# Patient Record
Sex: Male | Born: 1960 | Race: Black or African American | Hispanic: No | Marital: Single | Smoking: Never smoker
Health system: Southern US, Community
[De-identification: ages and names within clinical notes are randomized; demographics above are authoritative.]

## PROBLEM LIST (undated history)

## (undated) DIAGNOSIS — R569 Unspecified convulsions: Secondary | ICD-10-CM

## (undated) DIAGNOSIS — I10 Essential (primary) hypertension: Secondary | ICD-10-CM

---

## 2015-03-05 ENCOUNTER — Observation Stay
Admission: EM | Admit: 2015-03-05 | Discharge: 2015-03-06 | Disposition: A | Discharge status: Home / Self Care | Payer: No Typology Code available for payment source | Attending: Internal Medicine | Admitting: Internal Medicine

## 2015-03-05 ENCOUNTER — Emergency Department: Payer: No Typology Code available for payment source

## 2015-03-05 DIAGNOSIS — E872 Acidosis: Secondary | ICD-10-CM | POA: Diagnosis present

## 2015-03-05 DIAGNOSIS — R55 Syncope and collapse: Secondary | ICD-10-CM | POA: Insufficient documentation

## 2015-03-05 DIAGNOSIS — I10 Essential (primary) hypertension: Secondary | ICD-10-CM | POA: Insufficient documentation

## 2015-03-05 DIAGNOSIS — R569 Unspecified convulsions: Principal | ICD-10-CM

## 2015-03-05 DIAGNOSIS — I739 Peripheral vascular disease, unspecified: Secondary | ICD-10-CM | POA: Diagnosis not present

## 2015-03-05 DIAGNOSIS — E8729 Other acidosis: Secondary | ICD-10-CM

## 2015-03-05 DIAGNOSIS — R4182 Altered mental status, unspecified: Secondary | ICD-10-CM | POA: Insufficient documentation

## 2015-03-05 DIAGNOSIS — E860 Dehydration: Secondary | ICD-10-CM

## 2015-03-05 DIAGNOSIS — G9340 Encephalopathy, unspecified: Secondary | ICD-10-CM | POA: Insufficient documentation

## 2015-03-05 DIAGNOSIS — E876 Hypokalemia: Secondary | ICD-10-CM | POA: Insufficient documentation

## 2015-03-05 HISTORY — DX: Unspecified convulsions: R56.9

## 2015-03-05 HISTORY — DX: Essential (primary) hypertension: I10

## 2015-03-05 LAB — BASIC METABOLIC PANEL
ANION GAP: 22 — AB (ref 5–15)
Anion gap: 12 (ref 5–15)
BUN: 6 mg/dL (ref 6–20)
CALCIUM: 7.9 mg/dL — AB (ref 8.9–10.3)
CALCIUM: 7.6 mg/dL — AB (ref 8.9–10.3)
CHLORIDE: 103 mmol/L (ref 101–111)
CO2: 19 mmol/L — AB (ref 22–32)
CO2: 25 mmol/L (ref 22–32)
CREATININE: 1.21 mg/dL (ref 0.61–1.24)
CREATININE: 0.94 mg/dL (ref 0.61–1.24)
Chloride: 96 mmol/L — ABNORMAL LOW (ref 101–111)
GFR calc Af Amer: >60 mL/min (ref >60)
GFR calc non Af Amer: >60 mL/min (ref >60)
GFR calc non Af Amer: >60 mL/min (ref >60)
GLUCOSE: 201 mg/dL — AB (ref 65–99)
Glucose, Bld: 108 mg/dL — ABNORMAL HIGH (ref 65–99)
Potassium: 2.6 mmol/L — CL (ref 3.5–5.1)
Potassium: 2.8 mmol/L — CL (ref 3.5–5.1)
Sodium: 137 mmol/L (ref 135–145)
Sodium: 140 mmol/L (ref 135–145)

## 2015-03-05 LAB — HEPATIC FUNCTION PANEL
ALK PHOS: 80 U/L (ref 38–126)
ALT: 91 U/L — AB (ref 17–63)
AST: 158 U/L — ABNORMAL HIGH (ref 15–41)
Albumin: 4.4 g/dL (ref 3.5–5.0)
BILIRUBIN DIRECT: 0.3 mg/dL (ref 0.1–0.5)
BILIRUBIN INDIRECT: 1.1 mg/dL — AB (ref 0.3–0.9)
Total Bilirubin: 1.4 mg/dL — ABNORMAL HIGH (ref 0.3–1.2)
Total Protein: 7.8 g/dL (ref 6.5–8.1)

## 2015-03-05 LAB — BLOOD GAS, VENOUS
PH VEN: 7.42 (ref 7.320–7.430)
Patient temperature: 37
pCO2, Ven: 27 mmHg — ABNORMAL LOW (ref 44.0–60.0)

## 2015-03-05 LAB — ETHANOL: Alcohol, Ethyl (B): <5 mg/dL (ref <5)

## 2015-03-05 LAB — LIPASE, BLOOD: Lipase: 26 U/L (ref 22–51)

## 2015-03-05 MED ORDER — POTASSIUM CHLORIDE CRYS ER 20 MEQ PO TBCR
EXTENDED_RELEASE_TABLET | ORAL | Status: AC
Start: 2015-03-05 — End: 2015-03-05
  Administered 2015-03-05: 40 meq via ORAL
  Filled 2015-03-05: qty 2

## 2015-03-05 MED ORDER — POTASSIUM CHLORIDE 10 MEQ/100ML IV SOLN
10.0000 meq | INTRAVENOUS | Status: AC
  Administered 2015-03-06 (×3): 10 meq via INTRAVENOUS
  Filled 2015-03-05 (×3): qty 100

## 2015-03-05 MED ORDER — SODIUM CHLORIDE 0.9 % IV BOLUS (SEPSIS)
2000.0000 mL | Freq: Once | INTRAVENOUS | Status: AC
  Administered 2015-03-05: 2000 mL via INTRAVENOUS

## 2015-03-05 MED ORDER — ASPIRIN EC 81 MG PO TBEC
81.0000 mg | DELAYED_RELEASE_TABLET | Freq: Every day | ORAL | Status: DC
  Administered 2015-03-06: 10:00:00 81 mg via ORAL
  Filled 2015-03-05: qty 1

## 2015-03-05 MED ORDER — ENOXAPARIN SODIUM 40 MG/0.4ML ~~LOC~~ SOLN: 40.0000 mg | SUBCUTANEOUS | Status: DC

## 2015-03-05 MED ORDER — ONDANSETRON HCL 4 MG PO TABS
4.0000 mg | ORAL_TABLET | Freq: Four times a day (QID) | ORAL | Status: DC | PRN
Start: 2015-03-05 — End: 2015-03-06

## 2015-03-05 MED ORDER — POTASSIUM CHLORIDE CRYS ER 20 MEQ PO TBCR
40.0000 meq | EXTENDED_RELEASE_TABLET | ORAL | Status: AC
  Administered 2015-03-06: 40 meq via ORAL
  Filled 2015-03-05: qty 2

## 2015-03-05 MED ORDER — POTASSIUM CHLORIDE IN NACL 20-0.9 MEQ/L-% IV SOLN
INTRAVENOUS | Status: DC
  Administered 2015-03-05 – 2015-03-06 (×2): via INTRAVENOUS
  Filled 2015-03-05 (×6): qty 1000

## 2015-03-05 MED ORDER — HYDRALAZINE HCL 20 MG/ML IJ SOLN
INTRAMUSCULAR | Status: AC
  Administered 2015-03-05: 10 mg
  Filled 2015-03-05: qty 1

## 2015-03-05 MED ORDER — DEXTROSE 5 % AND 0.9 % NACL IV BOLUS
1000.0000 mL | Freq: Once | INTRAVENOUS | Status: AC
  Administered 2015-03-05: 1000 mL via INTRAVENOUS
  Filled 2015-03-05: qty 1000

## 2015-03-05 MED ORDER — LORAZEPAM 2 MG/ML IJ SOLN
INTRAMUSCULAR | Status: AC
  Administered 2015-03-05: 19:00:00
  Filled 2015-03-05: qty 1

## 2015-03-05 MED ORDER — ONDANSETRON HCL 4 MG/2ML IJ SOLN: 4.0000 mg | Freq: Four times a day (QID) | INTRAMUSCULAR | Status: DC | PRN

## 2015-03-05 MED ORDER — POTASSIUM CHLORIDE CRYS ER 20 MEQ PO TBCR
EXTENDED_RELEASE_TABLET | ORAL | Status: AC
  Administered 2015-03-05: 40 meq
  Filled 2015-03-05: qty 2

## 2015-03-05 MED ORDER — LACOSAMIDE 50 MG PO TABS
100.0000 mg | ORAL_TABLET | Freq: Two times a day (BID) | ORAL | Status: DC
  Administered 2015-03-06: 10:00:00 100 mg via ORAL
  Filled 2015-03-05: qty 2

## 2015-03-05 MED ORDER — LISINOPRIL 20 MG PO TABS
ORAL_TABLET | ORAL | Status: AC
  Administered 2015-03-05: 10 mg
  Filled 2015-03-05: qty 1

## 2015-03-05 MED ORDER — POTASSIUM CHLORIDE CRYS ER 20 MEQ PO TBCR
40.0000 meq | EXTENDED_RELEASE_TABLET | Freq: Once | ORAL | Status: AC
  Administered 2015-03-05: 40 meq via ORAL

## 2015-03-05 MED ORDER — HYDRALAZINE HCL 20 MG/ML IJ SOLN: 10.0000 mg | Freq: Four times a day (QID) | INTRAMUSCULAR | Status: DC | PRN

## 2015-03-05 MED ORDER — SODIUM CHLORIDE 0.9 % IV SOLN
200.0000 mg | Freq: Once | INTRAVENOUS | Status: AC
  Administered 2015-03-05: 200 mg via INTRAVENOUS
  Filled 2015-03-05: qty 20

## 2015-03-05 MED ORDER — ACETAMINOPHEN 650 MG RE SUPP: 650.0000 mg | Freq: Four times a day (QID) | RECTAL | Status: DC | PRN

## 2015-03-05 MED ORDER — LISINOPRIL 10 MG PO TABS
10.0000 mg | ORAL_TABLET | Freq: Every day | ORAL | Status: DC
  Administered 2015-03-06: 10 mg via ORAL
  Filled 2015-03-05: qty 1

## 2015-03-05 MED ORDER — METOPROLOL SUCCINATE ER 25 MG PO TB24
25.0000 mg | ORAL_TABLET | Freq: Every day | ORAL | Status: DC
  Administered 2015-03-06: 10:00:00 25 mg via ORAL
  Filled 2015-03-05: qty 1

## 2015-03-05 MED ORDER — SODIUM CHLORIDE 0.9 % IJ SOLN
3.0000 mL | Freq: Two times a day (BID) | INTRAMUSCULAR | Status: DC
  Administered 2015-03-06: 10:00:00 3 mL via INTRAVENOUS

## 2015-03-05 MED ORDER — METOPROLOL TARTRATE 25 MG PO TABS
ORAL_TABLET | ORAL | Status: AC
  Administered 2015-03-05: 25 mg
  Filled 2015-03-05: qty 1

## 2015-03-05 MED ORDER — ACETAMINOPHEN 325 MG PO TABS: 650.0000 mg | ORAL_TABLET | Freq: Four times a day (QID) | ORAL | Status: DC | PRN

## 2015-03-05 NOTE — ED Notes (Signed)
primedoc in with pt for admission.  Friend in with pt.
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
## 2015-03-05 NOTE — ED Notes (Signed)
Pt lying down.  Blood pressure elevated.  meds given.  Report called to floor , but due to high blood pressure,  Pt unable to go to floor yet. Pt alert.  Friend with pt.

## 2015-03-05 NOTE — ED Notes (Signed)
Resumed care from Visteon Corporationheather rn.  Pt alert.  Iv fluids changed.  md at bedside.  Sinus tach on monitor.  Skin warm and dry.

## 2015-03-05 NOTE — ED Provider Notes (Signed)
  Physical Exam  BP 143/104 mmHg  Pulse 109  Temp(Src) 98.6 F (37 C) (Oral)  Resp 21  Ht 5\' 7"  (1.702 m)  Wt 160 lb (72.576 kg)  BMI 25.05 kg/m2  SpO2 99%  Physical Exam Patient at time of handout at 3:15 PM without any alteration of mental status. Nonfocal neurologic exam and walking around the room at his baseline. Sign out was to follow-up with electrolytes after dextrose-containing fluids.  ----------------------------------------- 6:34 PM on 03/05/2015 -----------------------------------------  Patient seizing in room. Generalized tonic-clonic activity but patient is awake and able to answer questions. Right arm is flexed with left arm and both legs in extension. Facial twitches.  Seizure lasted about 2-4 minutes. Given 2 mg of Ativan during seizure episode.  ----------------------------------------- 6:54 PM on 03/05/2015 -----------------------------------------  Patient still with some mild twitching of the left lower extremity. No generalized seizure activity at this time. He is awake and talking but does not remember the event and is confused.  Discussed the case with Dr. Katrinka BlazingSmith of neurology and recommends 200 mg of Vimpat IV. Followed by 100 mg twice a day.  ED Course  Procedures  MDM Patient initially not wanted [facility] but now fianc is at bedside. The 2 discussed the case and the patient is agreeing to stay for further workup. Patient says that he had possible seizure 2 years ago and was worked up at an outside institution. However, I do not see any records on care anywhere. The patient does not know exactly which tests were done. He is not on any antiseizure medications and does not have primary care established. I will admit him to the hospital. Signed out to Dr. Elpidio AnisSudini.       Myrna Blazeravid Matthew Schaevitz, MD 03/05/15 2046
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
## 2015-03-05 NOTE — ED Notes (Signed)
md in with pt again. Iv meds infusing. Pt alert and talking.  Skin warm and dry.  nsr on monitor.  siderails up x 2

## 2015-03-05 NOTE — ED Notes (Signed)
CRITICAL VALUE ALERT  Critical value received:  K+ 2.6  Date of notification:  6/28  Time of notification: 1402  Critical value read back:Yes.    Nurse who received alert:  HF  MD Notified: Scotty CourtStafford

## 2015-03-05 NOTE — ED Notes (Signed)
Blood pressure improved.  Floor nurse rn cynthia aware.   Pt alert.  Friend with pt.

## 2015-03-05 NOTE — ED Notes (Signed)
Lab called with potassium level of 2.8   Dr Langston Maskershaevitz aware.   Pt to ct scan now.  siderails up x 2.  Pt talking.
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
## 2015-03-06 ENCOUNTER — Observation Stay: Payer: No Typology Code available for payment source

## 2015-03-06 DIAGNOSIS — R569 Unspecified convulsions: Secondary | ICD-10-CM

## 2015-03-06 LAB — URINE DRUG SCREEN, QUALITATIVE (ARMC ONLY)
Amphetamines, Ur Screen: NOT DETECTED
BARBITURATES, UR SCREEN: NOT DETECTED
Benzodiazepine, Ur Scrn: NOT DETECTED
COCAINE METABOLITE, UR ~~LOC~~: NOT DETECTED
Cannabinoid 50 Ng, Ur ~~LOC~~: NOT DETECTED
MDMA (Ecstasy)Ur Screen: NOT DETECTED
Methadone Scn, Ur: NOT DETECTED
Opiate, Ur Screen: NOT DETECTED
Phencyclidine (PCP) Ur S: NOT DETECTED
Tricyclic, Ur Screen: NOT DETECTED

## 2015-03-06 LAB — POTASSIUM: POTASSIUM: 3.8 mmol/L (ref 3.5–5.1)

## 2015-03-06 MED ORDER — GADOBENATE DIMEGLUMINE 529 MG/ML IV SOLN
15.0000 mL | Freq: Once | INTRAVENOUS | Status: AC | PRN
  Administered 2015-03-06: 15 mL via INTRAVENOUS

## 2015-03-06 MED ORDER — LACOSAMIDE 100 MG PO TABS: 100.0000 mg | ORAL_TABLET | Freq: Two times a day (BID) | ORAL | Status: AC

## 2015-03-06 MED ORDER — METOPROLOL SUCCINATE ER 25 MG PO TB24: 25.0000 mg | ORAL_TABLET | Freq: Every day | ORAL | Status: AC

## 2015-03-06 MED ORDER — LISINOPRIL 10 MG PO TABS: 10.0000 mg | ORAL_TABLET | Freq: Every day | ORAL | Status: AC
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
## 2015-03-06 NOTE — Discharge Instructions (Signed)
° ° °  Driving and Equipment Restrictions Some medical problems make it dangerous to drive, ride a bike, or use machines. Some of these problems are:  A hard blow to the head (concussion).  Passing out (fainting).  Twitching and shaking (seizures).  Low blood sugar.  Taking medicine to help you relax (sedatives).  Taking pain medicines.  Wearing an eye patch.  Wearing splints. This can make it hard to use parts of your body that you need to drive safely. HOME CARE   Do not drive until your doctor says it is okay.  Do not use machines until your doctor says it is okay. You may need a form signed by your doctor (medical release) before you can drive again. You may also need this form before you do other tasks where you need to be fully alert. MAKE SURE YOU:  Understand these instructions.  Will watch your condition.  Will get help right away if you are not doing well or get worse. Document Released: 10/01/2004 Document Revised: 11/16/2011 Document Reviewed: 01/01/2010 [facility] Patient Information 2015 [place], [place]. This information is not intended to replace advice given to you by your health care provider. Make sure you discuss any questions you have with your health care provider.   Seizure, Adult A seizure means there is unusual activity in the brain. A seizure can cause changes in attention or behavior. Seizures often cause shaking (convulsions). Seizures often last from 30 seconds to 2 minutes. HOME CARE   If you are given medicines, take them exactly as told by your doctor.  Keep all doctor visits as told.  Do not swim or drive until your doctor says it is okay.  Teach others what to do if you have a seizure. They should:  Lay you on the ground.  Put a cushion under your head.  Loosen any tight clothing around your neck.  Turn you on your side.  Stay with you until you get better. GET HELP RIGHT AWAY IF:   The seizure lasts longer than 2 to 5  minutes.  The seizure is very bad.  The person does not wake up after the seizure.  The person's attention or behavior changes. Drive the person to the emergency room or call your local emergency services (911 in [place] MAKE SURE YOU:   Understand these instructions.  Will watch your condition.  Will get help right away if you are not doing well or get worse. Document Released: 02/10/2008 Document Revised: 11/16/2011 Document Reviewed: 08/12/2011 [facility] Patient Information 2015 [place], [place]. This information is not intended to replace advice given to you by your health care provider. Make sure you discuss any questions you have with your health care provider.  Pt advised NOT to Drive till seen by Neurology as outpt and given permission then to do so Keep log of BP at home Make appt for PCP in your area

## 2015-03-06 NOTE — Plan of Care (Signed)
Problem: Discharge Progression Outcomes Goal: Other Discharge Outcomes/Goals Plan of care progress to goal: Pain: denies pain Hemodynamically: elevated BP Diet: tolerated well Activity: ambulating in the hall Waiting on Neurology consult

## 2015-03-06 NOTE — Plan of Care (Signed)
Problem: Discharge Progression Outcomes Goal: Discharge plan in place and appropriate Individualization Outcome: Progressing Pt lives at home with fiance. [place]. Has hx of high BP, no home meds. Does not have PCP, uses urgent care for medical concerns.     Goal: Pain controlled with appropriate interventions Outcome: Progressing Pt complains of no pain. Goal: Other Discharge Outcomes/Goals Outcome: Progressing Plan of care progress to goal: Pain - pt complains of no pain this shift Hemodynamically stable - pts BP runs high, monitoring per orders Complications resolved - pt here for observation after seizures Diet - tolerating Activity - frequent urination, calls before he goes to bathroom

## 2015-03-06 NOTE — Progress Notes (Signed)
Pt discharge home with fiancee-saline lock discontinued-escorted out via NT.

## 2016-08-19 IMAGING — CT CT HEAD W/O CM
1 series · 16 of 30 positions shown, 20 images · non-contrast
Comparison: None.

CLINICAL DATA: 54-year-old male with a history of altered mental
status. History she has years.

EXAM:
CT HEAD WITHOUT CONTRAST
TECHNIQUE: Contiguous axial images were obtained from the base of the skull
through the vertex without intravenous contrast.

[Series 2: head wo · axial · 0.41mm/px · z∈[-105,+21]mm · 16 of 32 slices shown, 20 images]
[im 2/32  brain]
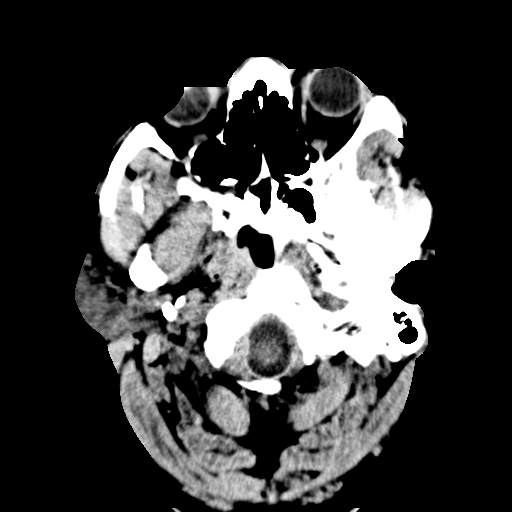
[im 2/32  bone]
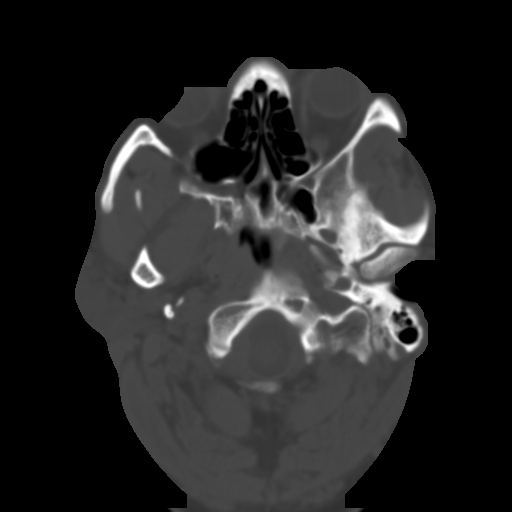
[im 4/32  brain]
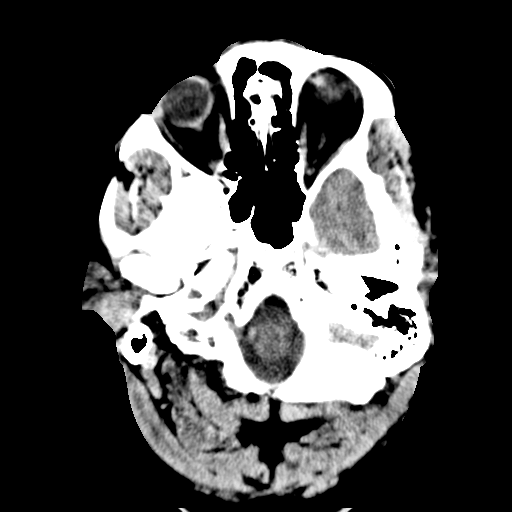
[im 6/32  brain]
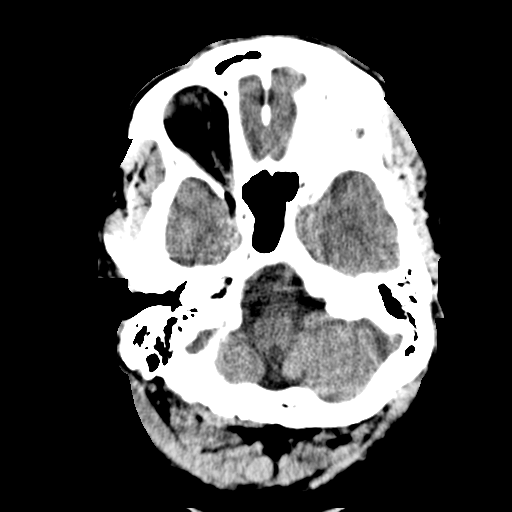
[im 8/32  brain]
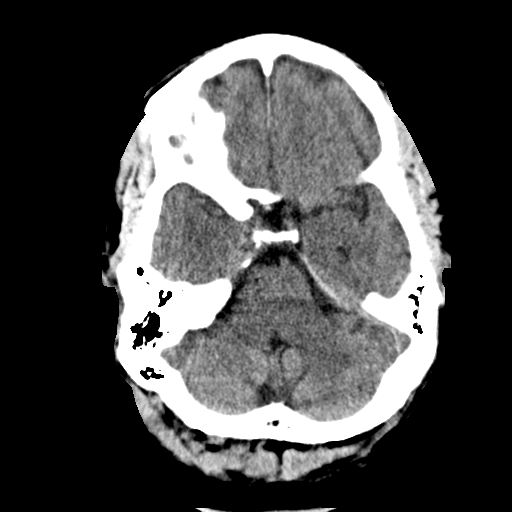
[im 9/32  brain]
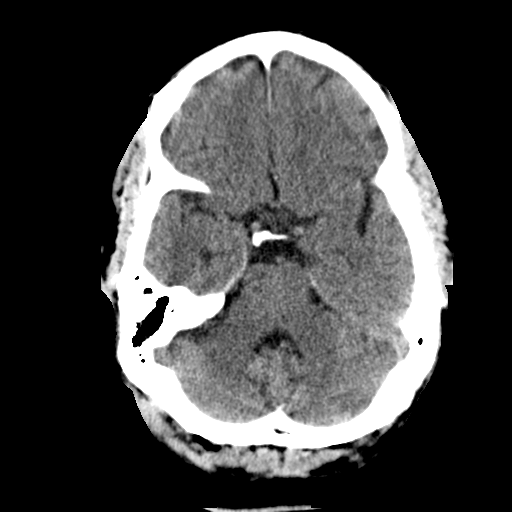
[im 9/32  bone]
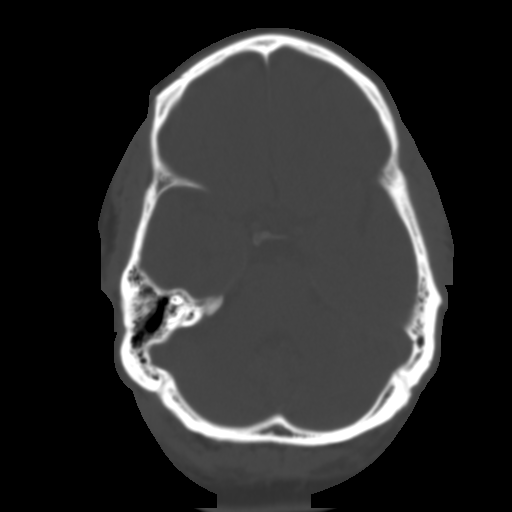
[im 11/32  brain]
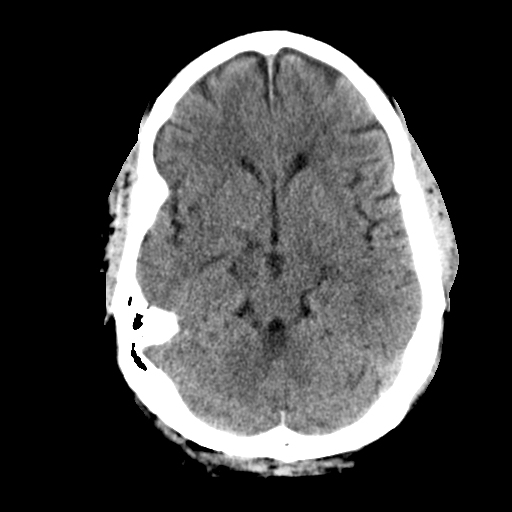
[im 13/32  brain]
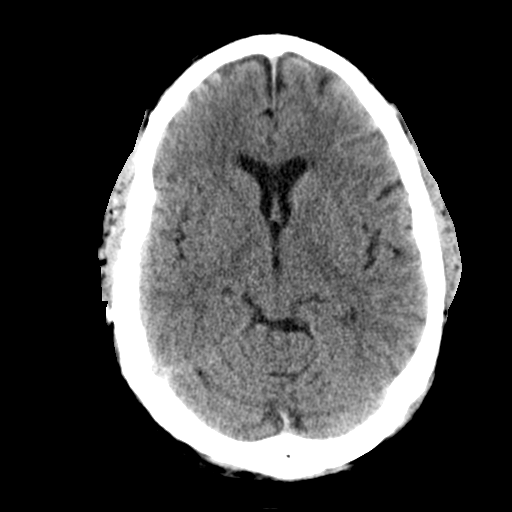
[im 15/32  brain]
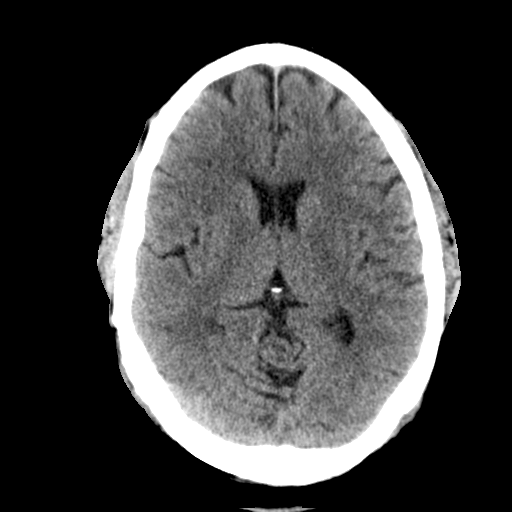
[im 17/32  brain]
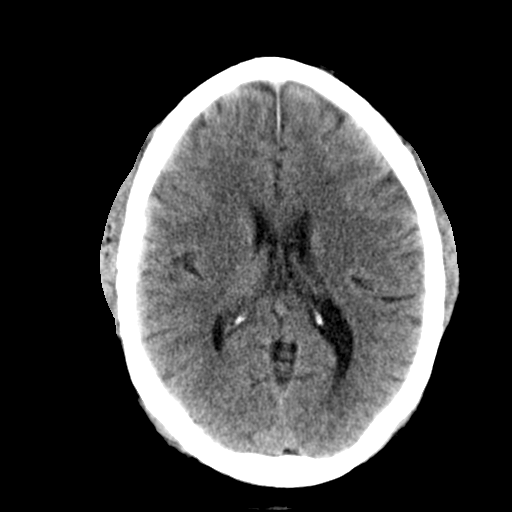
[im 17/32  bone]
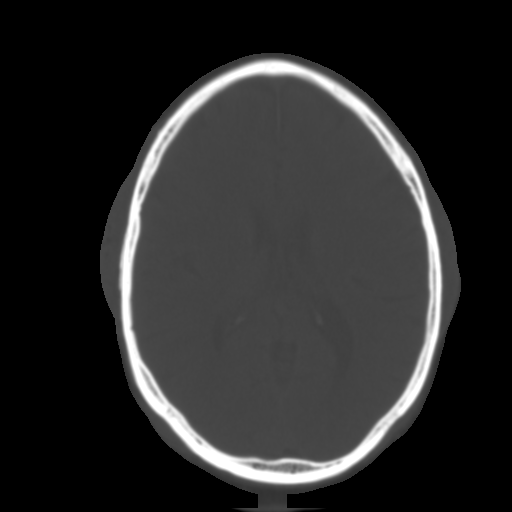
[im 19/32  brain]
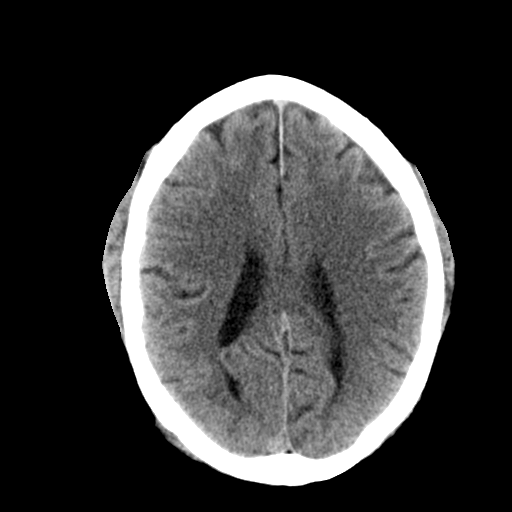
[im 21/32  brain]
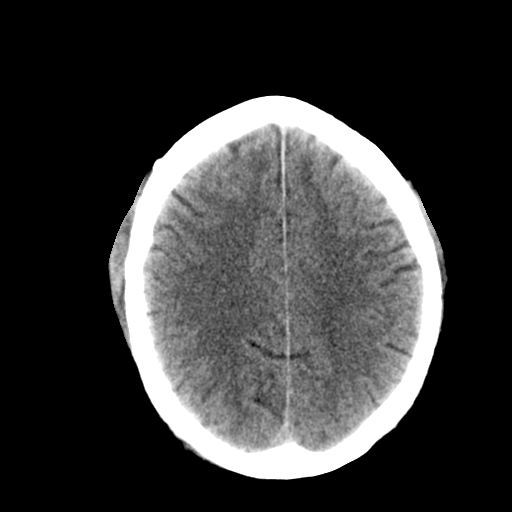
[im 23/32  brain]
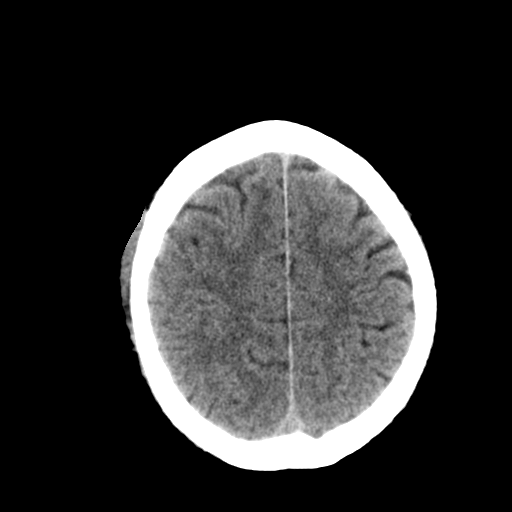
[im 24/32  brain]
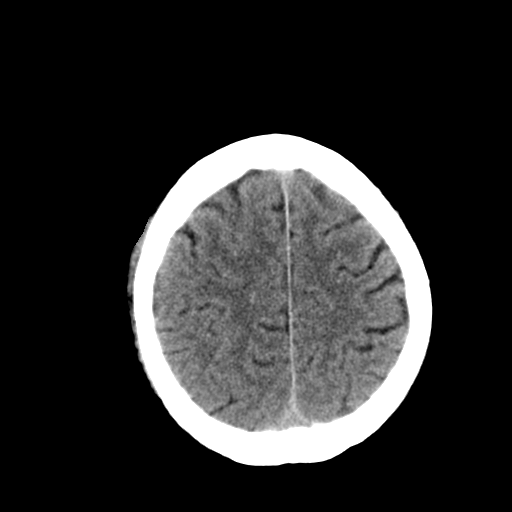
[im 24/32  bone]
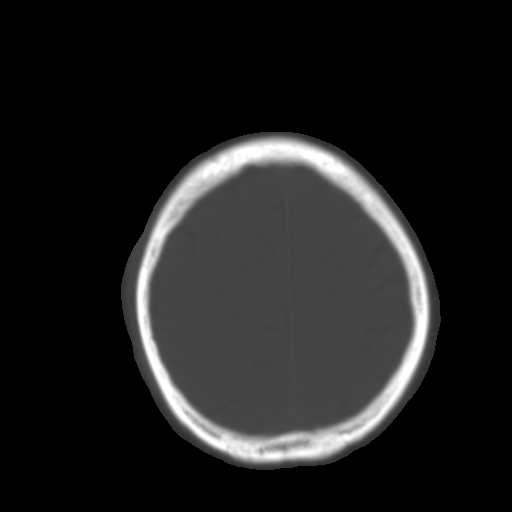
[im 26/32  brain]
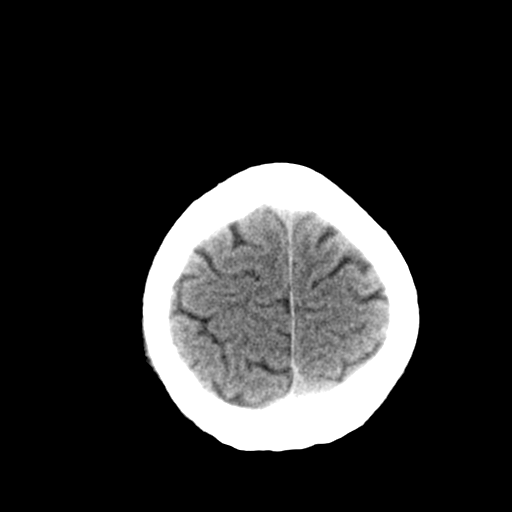
[im 28/32  brain]
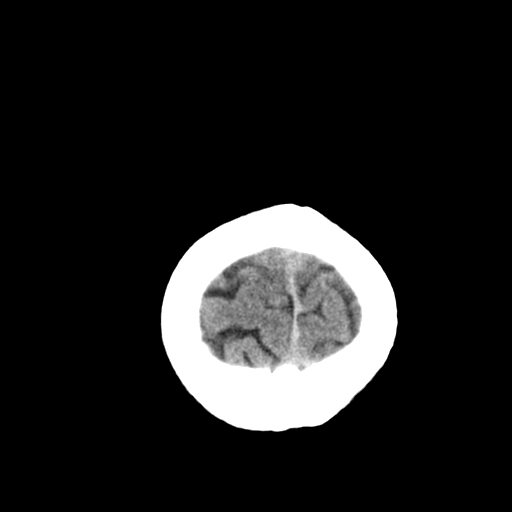
[im 30/32  brain]
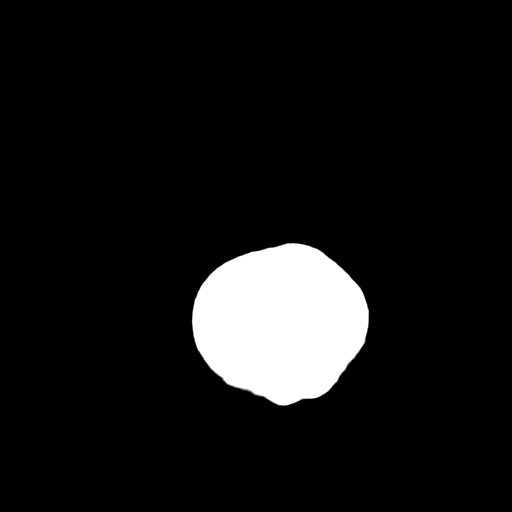

[16 of 30 positions shown; findings below may reference images not displayed]

FINDINGS: Unremarkable appearance of the calvarium without acute fracture or
aggressive lesion.

Unremarkable appearance of the scalp soft tissues.

Unremarkable appearance of the bilateral orbits.

Mastoid air cells are clear.

No significant paranasal sinus disease

No acute intracranial hemorrhage, midline shift, or mass effect.

Gray-white differentiation is maintained, without CT evidence of
acute ischemia.

Unremarkable configuration of the ventricles.
IMPRESSION: No CT evidence of acute intracranial abnormality.
